# Patient Record
Sex: Female | Born: 1995 | Race: White | Hispanic: No | Marital: Single | Smoking: Never smoker
Health system: Southern US, Community
[De-identification: ages and names within clinical notes are randomized; demographics above are authoritative.]

## PROBLEM LIST (undated history)

## (undated) DIAGNOSIS — R55 Syncope and collapse: Secondary | ICD-10-CM

## (undated) HISTORY — PX: OTHER SURGICAL HISTORY: SHX169

## (undated) HISTORY — DX: Syncope and collapse: R55

---
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
## 2016-01-02 ENCOUNTER — Ambulatory Visit (INDEPENDENT_AMBULATORY_CARE_PROVIDER_SITE_OTHER): Payer: PRIVATE HEALTH INSURANCE | Admitting: Family Medicine

## 2016-01-02 VITALS — BP 109/60 | HR 74 | Temp 98.7°F | Resp 14

## 2016-01-02 DIAGNOSIS — Z025 Encounter for examination for participation in sport: Secondary | ICD-10-CM | POA: Diagnosis not present

## 2016-01-02 NOTE — Progress Notes (Signed)
Patient presents today for ferritin level. Patient is a cross-country runner. She denies any problems with anemia in the past. She does take a multivitamin and vitamin D daily. She denies any history of stress injury. Patient admits to recently having irregular menstrual cycles which she feels could be do to her new birth control which she started 2 months ago.  ROS: Negative except mentioned above. Vitals as per Epic.  GENERAL: NAD RESP: CTA B CARD: RRR NEURO: CN II-XII grossly intact   A/P: Sports Physical Lab Work - ferritin drawn, f/u with GYN or me regarding irregular menses/OCP if still causing problems after one month.

## 2016-01-03 LAB — FERRITIN: FERRITIN: 38 ng/mL (ref 15–77)

## 2016-04-09 IMAGING — CR DG TIBIA/FIBULA 2V*L*
1 series · 2 of 2 positions shown · non-contrast
Comparison: None.

CLINICAL DATA: Left medial posterior lower leg pain for days. No
known injury. Patient is a cross-country runner and pain is worse
with walking and running.

EXAM:
LEFT TIBIA AND FIBULA - 2 VIEW

[Series 1: dg tibia/fibula left · 0.14mm/px · 2 of 2 slices shown]
[im 1/2]
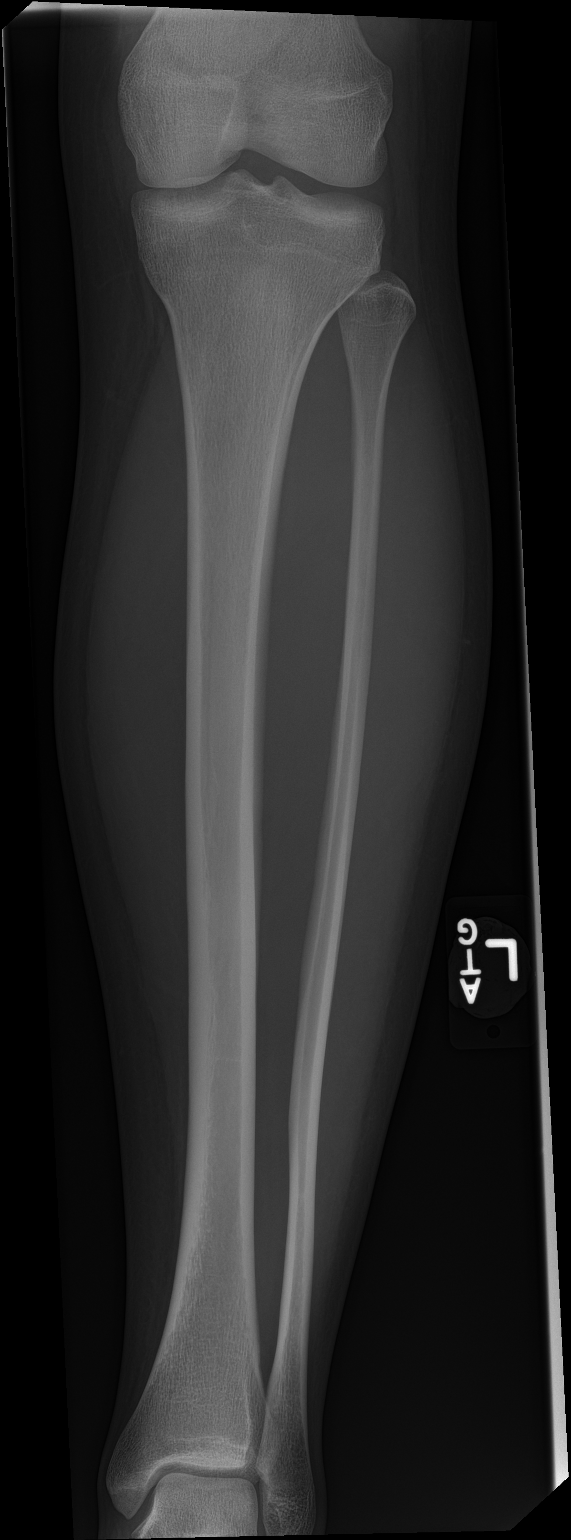
[im 2/2]
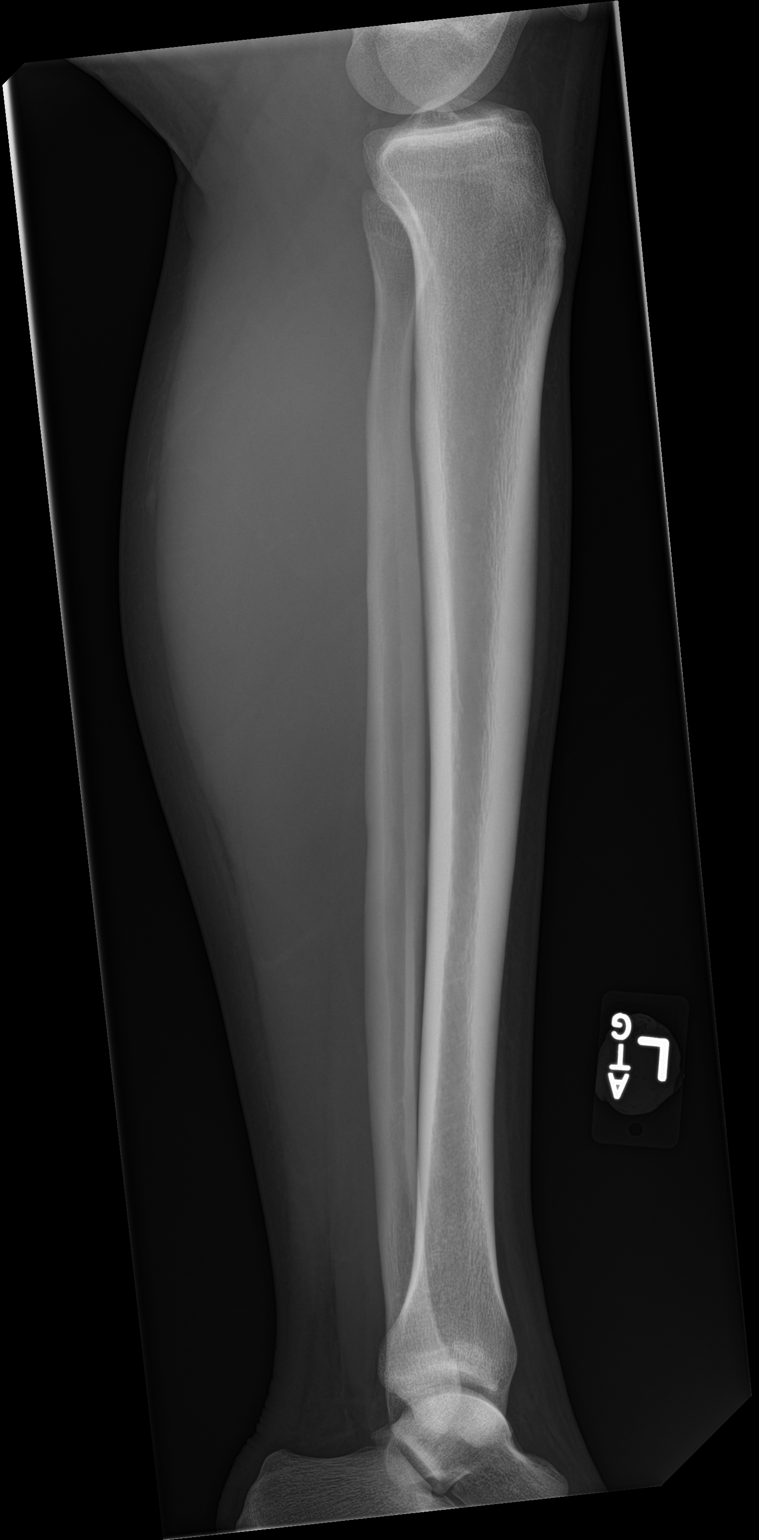

[2 of 2 positions shown; findings below may reference images not displayed]

FINDINGS: Osseous alignment is normal. Bone mineralization is normal. No
fracture line or displaced fracture fragment identified. No cortical
irregularity or periosteal reaction to suggest developing stress
fracture. Soft tissues about the left tib-fib are unremarkable
IMPRESSION: Normal plain film examination of the left tibia and fibula. No
fracture or dislocation seen.

## 2016-05-11 ENCOUNTER — Ambulatory Visit: Payer: PRIVATE HEALTH INSURANCE | Admitting: Family Medicine

## 2016-05-12 ENCOUNTER — Encounter: Payer: Self-pay | Admitting: Family Medicine

## 2016-05-12 ENCOUNTER — Ambulatory Visit (INDEPENDENT_AMBULATORY_CARE_PROVIDER_SITE_OTHER): Payer: PRIVATE HEALTH INSURANCE | Admitting: Family Medicine

## 2016-05-12 VITALS — BP 111/61 | HR 62 | Temp 98.7°F | Resp 14

## 2016-05-12 DIAGNOSIS — D509 Iron deficiency anemia, unspecified: Secondary | ICD-10-CM

## 2016-05-12 NOTE — Progress Notes (Signed)
Patient presents today for repeat blood work. Patient's parent and level was drawn in 12/2015 and was 38. Patient does take some iron that is combined with her birth control. Patient states that she is on her menses right now. She has had one episode where she has had her period twice in a month on her birth control. She believes this is due to the fact that she forgot to take her birth control pill 1 morning and took it later that night. She denies any issues with fatigue or heavy menses. Her vitamin D level was normal.  Exam- deferred  A/P: Will check ferritin level and serum iron and TIBC. I have instructed the patient to inform me of the amount of iron that is in her birth control pill. If her irregular menses continues I would recommend that she see gynecology to discuss whether her birth control needs to be changed. Patient addresses understanding and will follow up with me if needed.

## 2016-05-13 LAB — IRON AND TIBC
IRON SATURATION: 82 % — AB (ref 15–55)
Iron: 269 ug/dL (ref 27–159)
Total Iron Binding Capacity: 328 ug/dL (ref 250–450)
UIBC: 59 ug/dL — ABNORMAL LOW (ref 131–425)

## 2016-05-13 LAB — FERRITIN: FERRITIN: 57 ng/mL (ref 15–77)

## 2016-10-11 IMAGING — CR DG FOOT COMPLETE 3+V*L*
1 series · 3 of 3 positions shown · non-contrast
Comparison: 07/22/2015

CLINICAL DATA: Erythema over the left (initial history of right
great toe symptoms was erroneous) great toe.

EXAM:
LEFT FOOT - COMPLETE 3+ VIEW

[Series 1: dg foot complete left · 0.14mm/px · 3 of 3 slices shown]
[im 1/3]
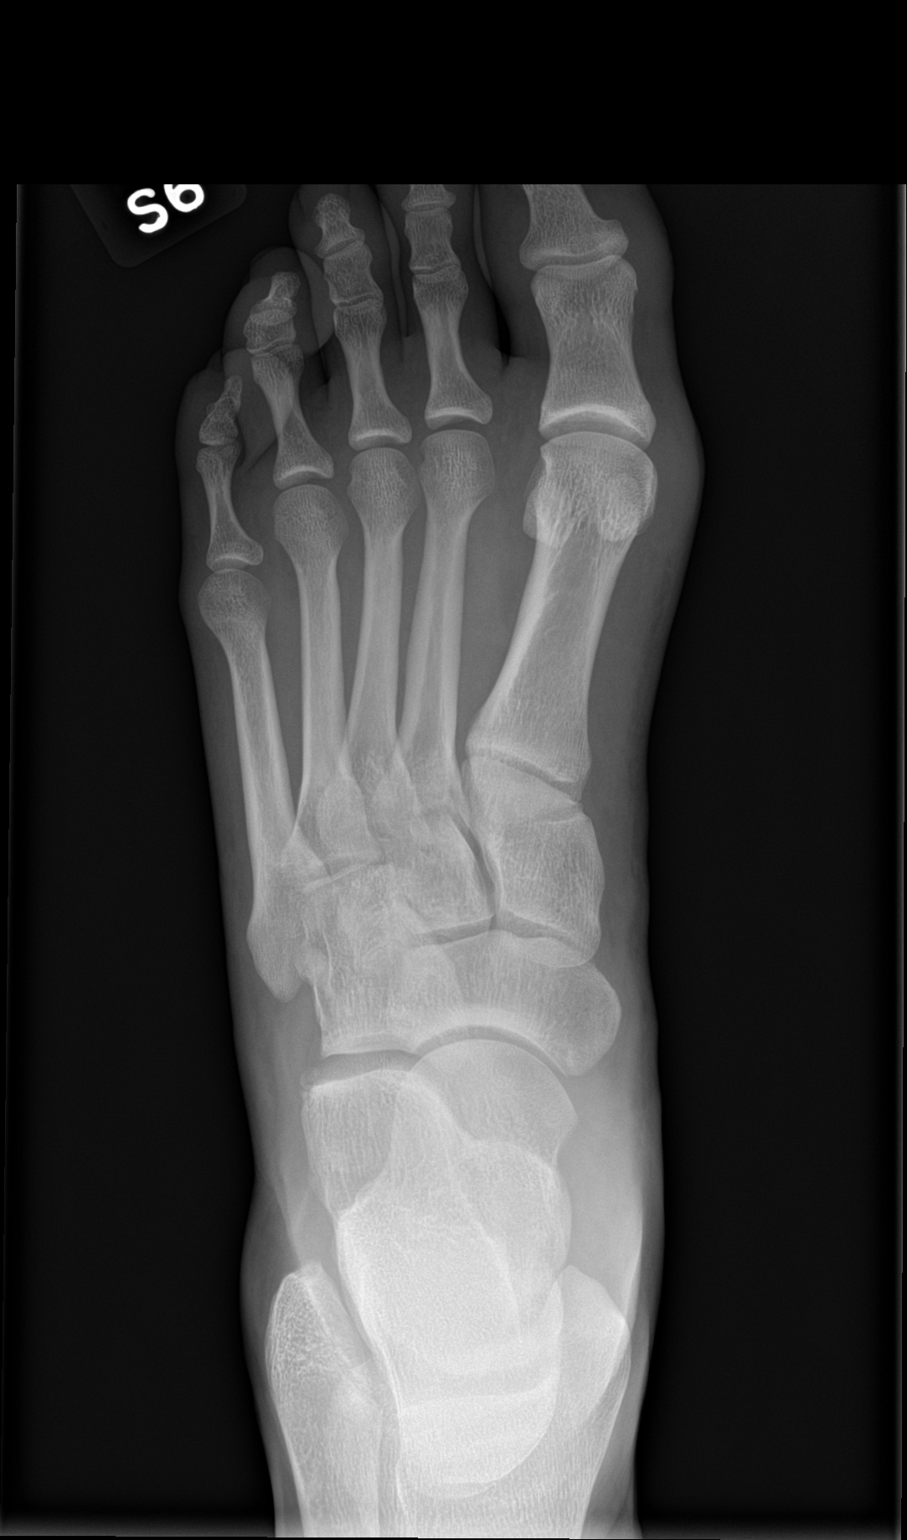
[im 2/3]
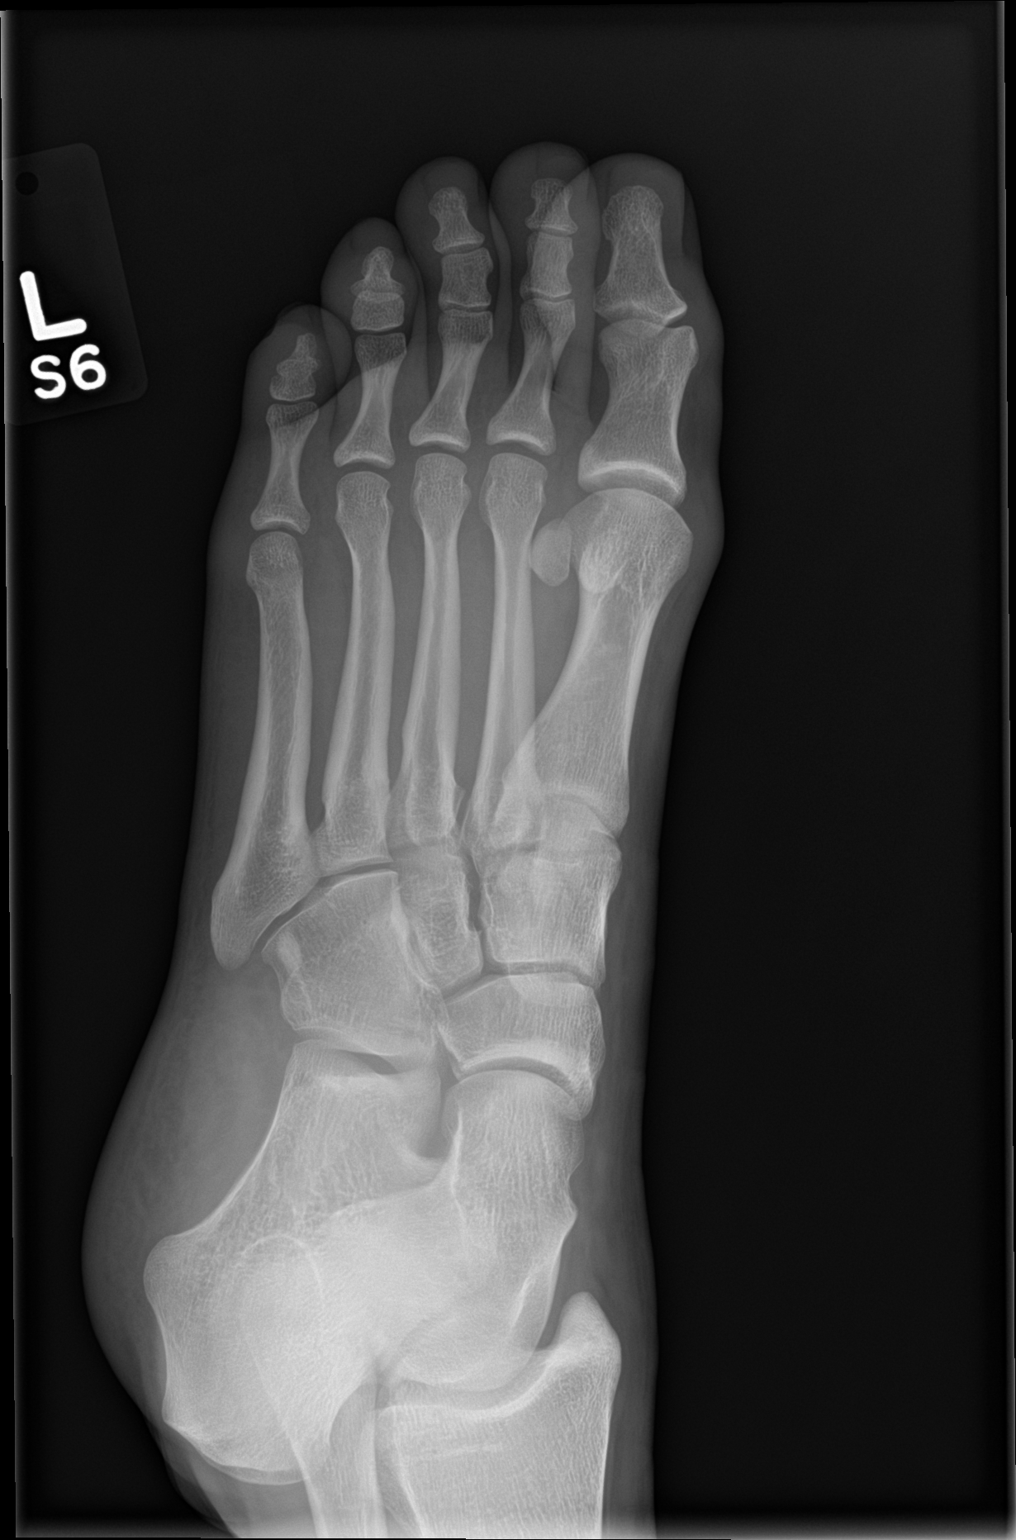
[im 3/3]
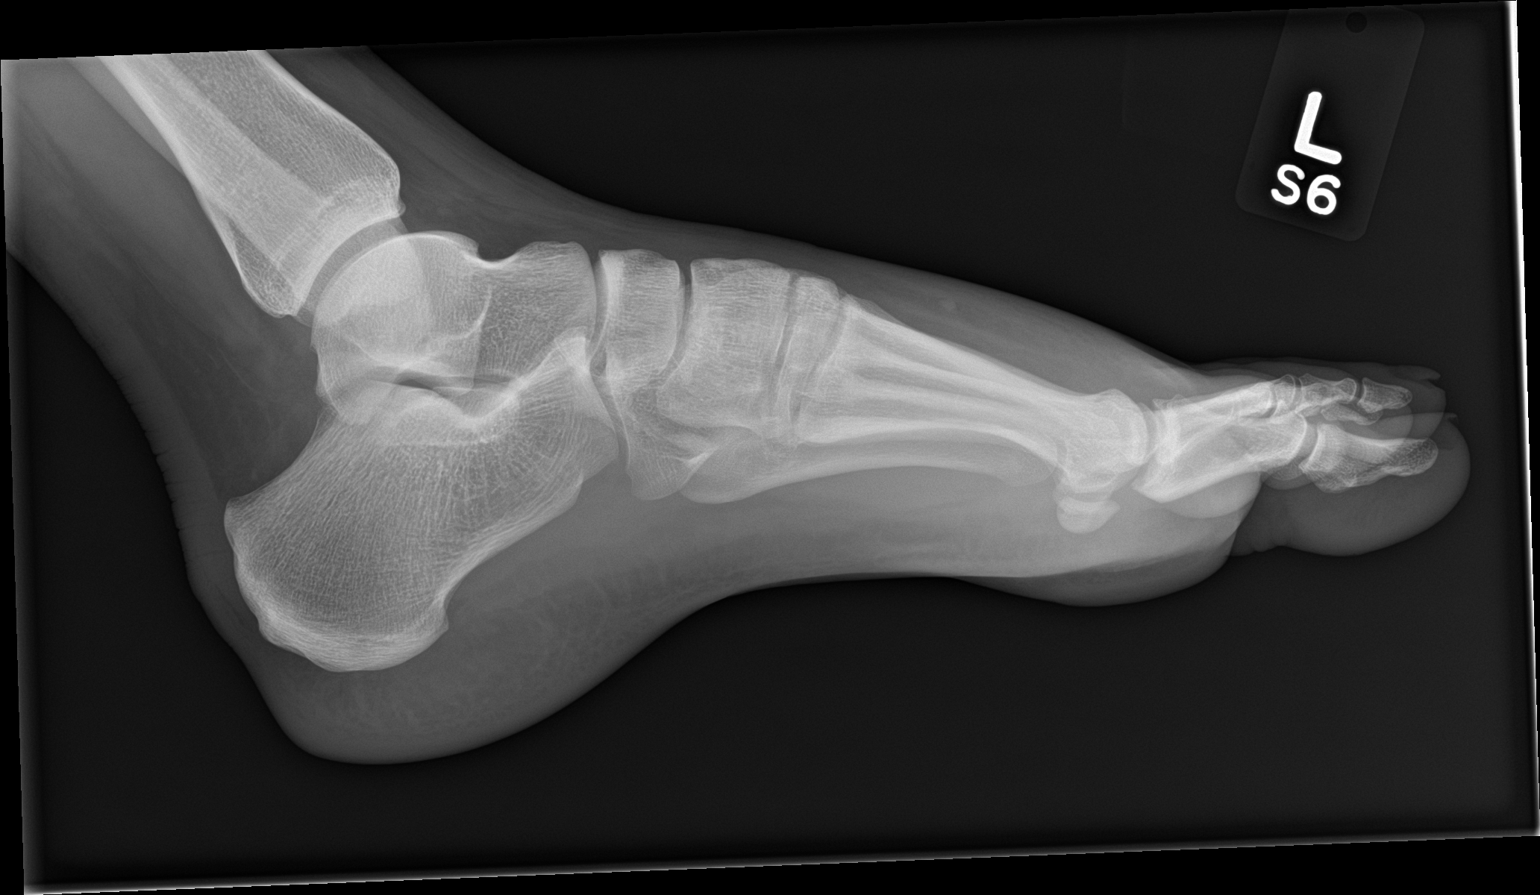

[3 of 3 positions shown; findings below may reference images not displayed]

FINDINGS: There is no evidence of fracture or dislocation. There is no
evidence of arthropathy or other focal bone abnormality. Soft
tissues are unremarkable.
IMPRESSION: Negative.

## 2017-03-05 ENCOUNTER — Ambulatory Visit (INDEPENDENT_AMBULATORY_CARE_PROVIDER_SITE_OTHER): Payer: PRIVATE HEALTH INSURANCE | Admitting: Family Medicine

## 2017-03-05 VITALS — BP 112/70 | HR 62 | Temp 98.6°F | Resp 14

## 2017-03-05 DIAGNOSIS — D509 Iron deficiency anemia, unspecified: Secondary | ICD-10-CM

## 2017-03-19 NOTE — Progress Notes (Signed)
Patient has viral illness and does not want to have blood drawn today. She states that she will follow up at another time.

## 2017-05-20 ENCOUNTER — Ambulatory Visit (INDEPENDENT_AMBULATORY_CARE_PROVIDER_SITE_OTHER): Payer: PRIVATE HEALTH INSURANCE | Admitting: Family Medicine

## 2017-05-20 ENCOUNTER — Encounter: Payer: Self-pay | Admitting: Family Medicine

## 2017-05-20 VITALS — BP 115/65 | HR 61 | Temp 98.2°F | Resp 14

## 2017-05-20 DIAGNOSIS — E559 Vitamin D deficiency, unspecified: Secondary | ICD-10-CM

## 2017-05-20 DIAGNOSIS — D508 Other iron deficiency anemias: Secondary | ICD-10-CM

## 2017-05-20 NOTE — Progress Notes (Signed)
Patient here for ferritin and vitamin D levels. Patient has had a low ferritin in the past. She does not take any supplements currently. She denies any dietary restrictions. She denies any bony injuries this season. Has had bone injuries in the past.She has no menstrual issues. Patient is a cross country runner.  Exam deferred.  A/P:Will check labs today and informed patient if she needs to supplement.

## 2017-05-21 LAB — VITAMIN D 25 HYDROXY (VIT D DEFICIENCY, FRACTURES): VIT D 25 HYDROXY: 44.3 ng/mL (ref 30.0–100.0)

## 2017-05-21 LAB — IRON AND TIBC
IRON SATURATION: 68 % — AB (ref 15–55)
Iron: 203 ug/dL — ABNORMAL HIGH (ref 27–159)
Total Iron Binding Capacity: 299 ug/dL (ref 250–450)
UIBC: 96 ug/dL — ABNORMAL LOW (ref 131–425)

## 2017-05-21 LAB — FERRITIN: Ferritin: 82 ng/mL (ref 15–150)
